# Patient Record
Sex: Female | Born: 1985 | Hispanic: No | Marital: Married | Smoking: Never smoker
Health system: Southern US, Community
[De-identification: ages and names within clinical notes are randomized; demographics above are authoritative.]

## PROBLEM LIST (undated history)

## (undated) DIAGNOSIS — F32A Depression, unspecified: Secondary | ICD-10-CM

## (undated) DIAGNOSIS — F329 Major depressive disorder, single episode, unspecified: Secondary | ICD-10-CM

---

## 2016-05-22 ENCOUNTER — Emergency Department (HOSPITAL_COMMUNITY): Payer: BLUE CROSS/BLUE SHIELD

## 2016-05-22 ENCOUNTER — Encounter (HOSPITAL_COMMUNITY): Payer: Self-pay | Admitting: Emergency Medicine

## 2016-05-22 ENCOUNTER — Emergency Department (HOSPITAL_COMMUNITY)
Admission: EM | Admit: 2016-05-22 | Discharge: 2016-05-22 | Disposition: A | Discharge status: Home / Self Care | Payer: BLUE CROSS/BLUE SHIELD | Attending: Emergency Medicine | Admitting: Emergency Medicine

## 2016-05-22 DIAGNOSIS — Y92410 Unspecified street and highway as the place of occurrence of the external cause: Secondary | ICD-10-CM | POA: Insufficient documentation

## 2016-05-22 DIAGNOSIS — Y939 Activity, unspecified: Secondary | ICD-10-CM | POA: Diagnosis not present

## 2016-05-22 DIAGNOSIS — Y929 Unspecified place or not applicable: Secondary | ICD-10-CM | POA: Diagnosis not present

## 2016-05-22 DIAGNOSIS — M542 Cervicalgia: Secondary | ICD-10-CM | POA: Diagnosis present

## 2016-05-22 DIAGNOSIS — R51 Headache: Secondary | ICD-10-CM | POA: Diagnosis not present

## 2016-05-22 DIAGNOSIS — R519 Headache, unspecified: Secondary | ICD-10-CM

## 2016-05-22 DIAGNOSIS — Y999 Unspecified external cause status: Secondary | ICD-10-CM | POA: Insufficient documentation

## 2016-05-22 HISTORY — DX: Major depressive disorder, single episode, unspecified: F32.9

## 2016-05-22 HISTORY — DX: Depression, unspecified: F32.A

## 2016-05-22 MED ORDER — OXYCODONE-ACETAMINOPHEN 5-325 MG PO TABS: 1.0000 | ORAL_TABLET | Freq: Once | ORAL | Status: DC

## 2016-05-22 MED ORDER — OXYCODONE-ACETAMINOPHEN 5-325 MG PO TABS
1.0000 | ORAL_TABLET | Freq: Once | ORAL | Status: AC
  Administered 2016-05-22: 1 via ORAL
  Filled 2016-05-22: qty 1

## 2016-05-22 MED ORDER — IBUPROFEN 800 MG PO TABS: 800.0000 mg | ORAL_TABLET | Freq: Three times a day (TID) | ORAL | 0 refills | Status: AC
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
